# Patient Record
Sex: Female | Born: 1955 | Race: White | Hispanic: No | Marital: Married
Health system: Southern US, Community
[De-identification: ages and names within clinical notes are randomized; demographics above are authoritative.]

---

## 1983-01-02 HISTORY — PX: AUGMENTATION MAMMAPLASTY: SUR837

## 2004-08-24 ENCOUNTER — Ambulatory Visit: Payer: Self-pay | Admitting: Podiatry

## 2006-02-08 ENCOUNTER — Other Ambulatory Visit: Admission: RE | Admit: 2006-02-08 | Discharge: 2006-02-08 | Payer: Self-pay | Admitting: Family Medicine

## 2007-02-10 ENCOUNTER — Other Ambulatory Visit: Admission: RE | Admit: 2007-02-10 | Discharge: 2007-02-10 | Payer: Self-pay | Admitting: Family Medicine

## 2008-03-01 ENCOUNTER — Other Ambulatory Visit: Admission: RE | Admit: 2008-03-01 | Discharge: 2008-03-01 | Payer: Self-pay | Admitting: Family Medicine

## 2009-10-24 ENCOUNTER — Ambulatory Visit: Payer: Self-pay | Admitting: Gastroenterology

## 2010-08-04 ENCOUNTER — Ambulatory Visit: Payer: Self-pay | Admitting: Family Medicine

## 2011-07-19 ENCOUNTER — Ambulatory Visit: Payer: Self-pay | Admitting: Family Medicine

## 2015-05-23 ENCOUNTER — Other Ambulatory Visit: Payer: Self-pay | Admitting: Family Medicine

## 2015-05-23 DIAGNOSIS — Z1231 Encounter for screening mammogram for malignant neoplasm of breast: Secondary | ICD-10-CM

## 2015-10-03 ENCOUNTER — Ambulatory Visit
Admission: RE | Admit: 2015-10-03 | Discharge: 2015-10-03 | Disposition: A | Discharge status: Home / Self Care | Payer: BLUE CROSS/BLUE SHIELD | Source: Ambulatory Visit | Attending: Family Medicine | Admitting: Family Medicine

## 2015-10-03 ENCOUNTER — Encounter: Payer: Self-pay | Admitting: Radiology

## 2015-10-03 DIAGNOSIS — Z1231 Encounter for screening mammogram for malignant neoplasm of breast: Secondary | ICD-10-CM | POA: Diagnosis present

## 2017-11-18 ENCOUNTER — Other Ambulatory Visit: Payer: Self-pay | Admitting: Family Medicine

## 2017-11-18 DIAGNOSIS — Z1231 Encounter for screening mammogram for malignant neoplasm of breast: Secondary | ICD-10-CM

## 2017-11-27 ENCOUNTER — Inpatient Hospital Stay: Admission: RE | Admit: 2017-11-27 | Payer: BLUE CROSS/BLUE SHIELD | Source: Ambulatory Visit

## 2018-02-25 ENCOUNTER — Telehealth: Payer: Self-pay | Admitting: General Surgery

## 2018-02-25 NOTE — Telephone Encounter (Signed)
Patient has called during lunch hour and left a message

## 2018-02-27 ENCOUNTER — Ambulatory Visit: Payer: Self-pay | Admitting: General Surgery

## 2018-06-16 ENCOUNTER — Encounter: Payer: Self-pay | Admitting: General Surgery

## 2019-07-14 ENCOUNTER — Institutional Professional Consult (permissible substitution): Payer: BLUE CROSS/BLUE SHIELD | Admitting: Plastic Surgery

## 2019-08-21 ENCOUNTER — Institutional Professional Consult (permissible substitution): Payer: BLUE CROSS/BLUE SHIELD | Admitting: Plastic Surgery

## 2020-10-04 ENCOUNTER — Other Ambulatory Visit: Payer: Self-pay | Admitting: Family Medicine

## 2020-10-04 DIAGNOSIS — Z1231 Encounter for screening mammogram for malignant neoplasm of breast: Secondary | ICD-10-CM

## 2020-10-20 ENCOUNTER — Ambulatory Visit
Admission: RE | Admit: 2020-10-20 | Discharge: 2020-10-20 | Disposition: A | Discharge status: Home / Self Care | Payer: BC Managed Care – PPO | Source: Ambulatory Visit | Attending: Family Medicine | Admitting: Family Medicine

## 2020-10-20 ENCOUNTER — Other Ambulatory Visit: Payer: Self-pay

## 2020-10-20 DIAGNOSIS — Z1231 Encounter for screening mammogram for malignant neoplasm of breast: Secondary | ICD-10-CM | POA: Diagnosis not present

## 2020-11-07 ENCOUNTER — Inpatient Hospital Stay
Admission: RE | Admit: 2020-11-07 | Discharge: 2020-11-07 | Disposition: A | Discharge status: Home / Self Care | Payer: Self-pay | Source: Ambulatory Visit | Attending: *Deleted | Admitting: *Deleted

## 2020-11-07 ENCOUNTER — Other Ambulatory Visit: Payer: Self-pay | Admitting: *Deleted

## 2020-11-07 DIAGNOSIS — Z1231 Encounter for screening mammogram for malignant neoplasm of breast: Secondary | ICD-10-CM

## 2021-03-01 ENCOUNTER — Other Ambulatory Visit: Payer: Self-pay | Admitting: Orthopedic Surgery

## 2021-03-01 DIAGNOSIS — M25311 Other instability, right shoulder: Secondary | ICD-10-CM

## 2021-03-01 DIAGNOSIS — M7501 Adhesive capsulitis of right shoulder: Secondary | ICD-10-CM

## 2021-03-01 DIAGNOSIS — M7521 Bicipital tendinitis, right shoulder: Secondary | ICD-10-CM

## 2021-03-13 ENCOUNTER — Other Ambulatory Visit: Payer: BC Managed Care – PPO

## 2021-10-30 ENCOUNTER — Other Ambulatory Visit: Payer: Self-pay | Admitting: Family Medicine

## 2021-10-30 DIAGNOSIS — Z1231 Encounter for screening mammogram for malignant neoplasm of breast: Secondary | ICD-10-CM

## 2021-11-22 ENCOUNTER — Ambulatory Visit
Admission: RE | Admit: 2021-11-22 | Discharge: 2021-11-22 | Disposition: A | Discharge status: Home / Self Care | Payer: BC Managed Care – PPO | Source: Ambulatory Visit | Attending: Family Medicine | Admitting: Family Medicine

## 2021-11-22 ENCOUNTER — Other Ambulatory Visit: Payer: Self-pay | Admitting: Family Medicine

## 2021-11-22 DIAGNOSIS — Z1231 Encounter for screening mammogram for malignant neoplasm of breast: Secondary | ICD-10-CM

## 2022-01-04 ENCOUNTER — Ambulatory Visit: Payer: BC Managed Care – PPO

## 2023-04-24 IMAGING — MG DIGITAL SCREENING BREAST BILAT IMPLANT W/ TOMO W/ CAD
8 of 14 series · 8 of 34 positions shown · non-contrast
Comparison: Previous exam(s).

CLINICAL DATA: Screening.

EXAM:
DIGITAL SCREENING BILATERAL MAMMOGRAM WITH IMPLANTS, CAD AND
TOMOSYNTHESIS
TECHNIQUE: Bilateral screening digital craniocaudal and mediolateral oblique
mammograms were obtained. Bilateral screening digital breast
tomosynthesis was performed. The images were evaluated with
computer-aided detection. Standard and/or implant displaced views
were performed.

[L MLO]
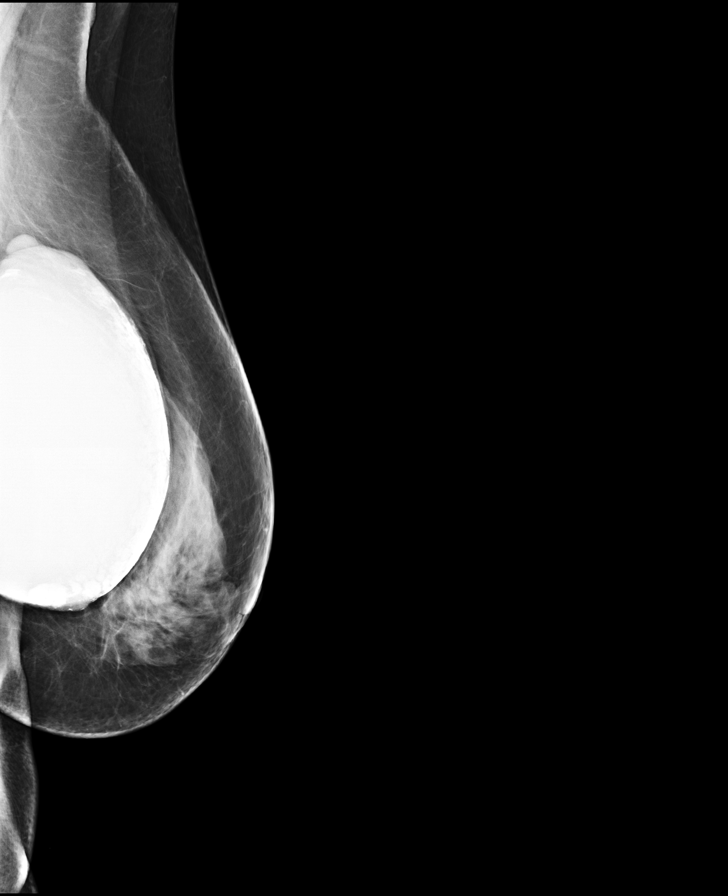

[R MLO]
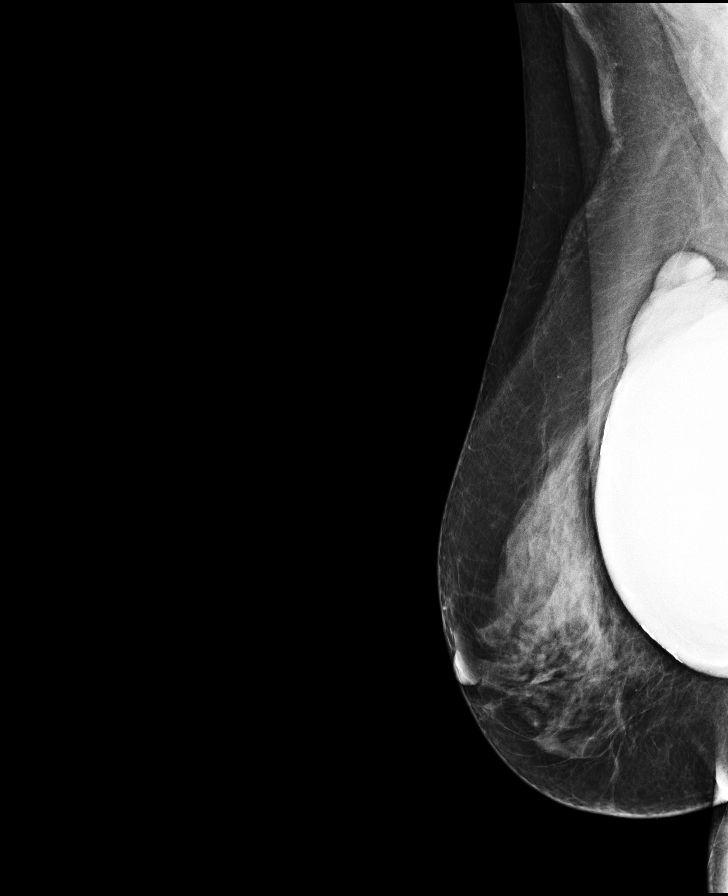

[L CC]
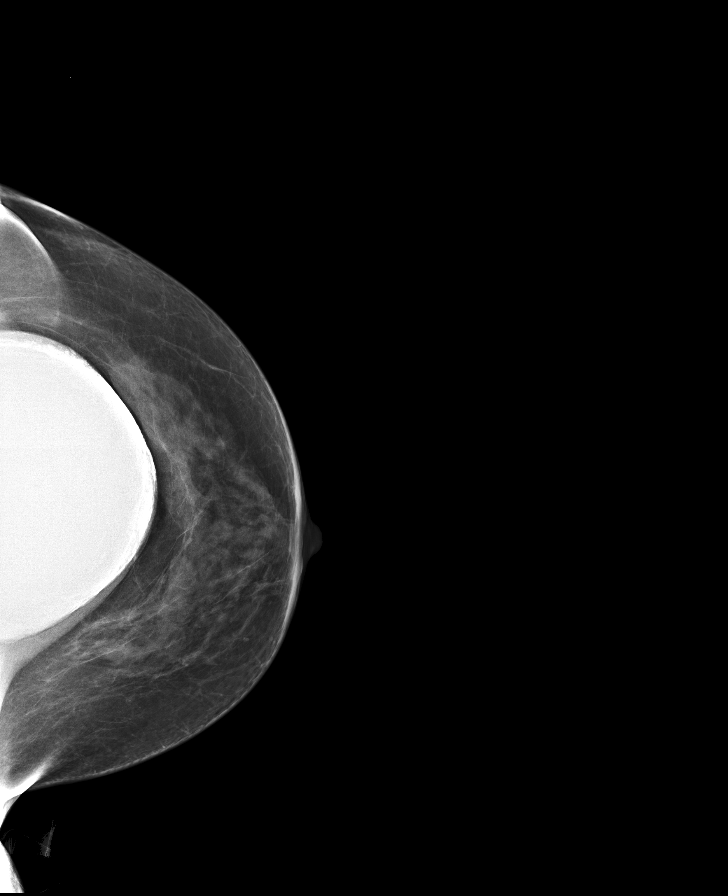

[R CC]
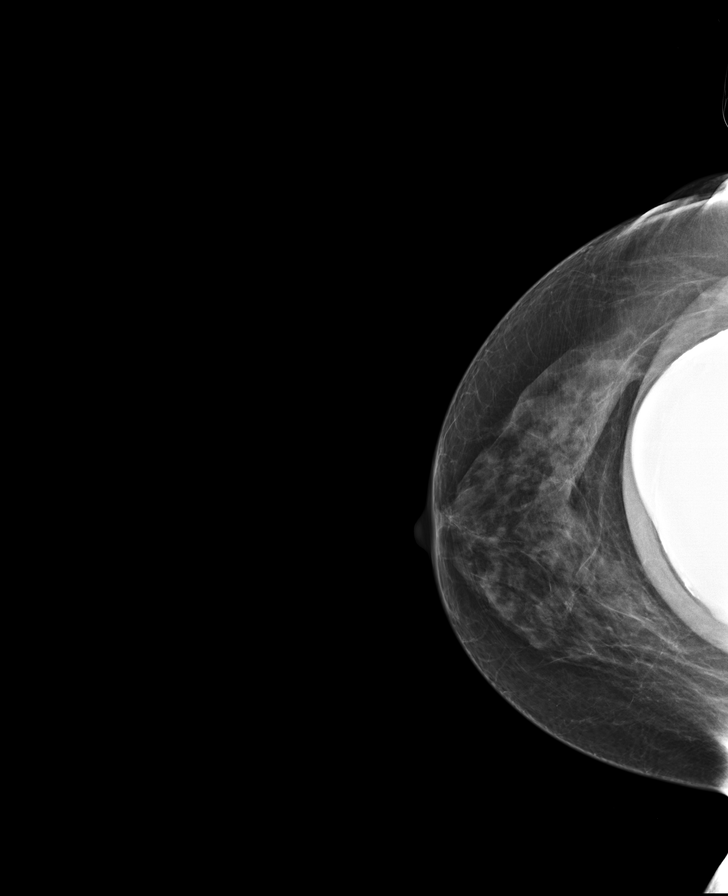

[L MLO synth-2D (1 of 2)]
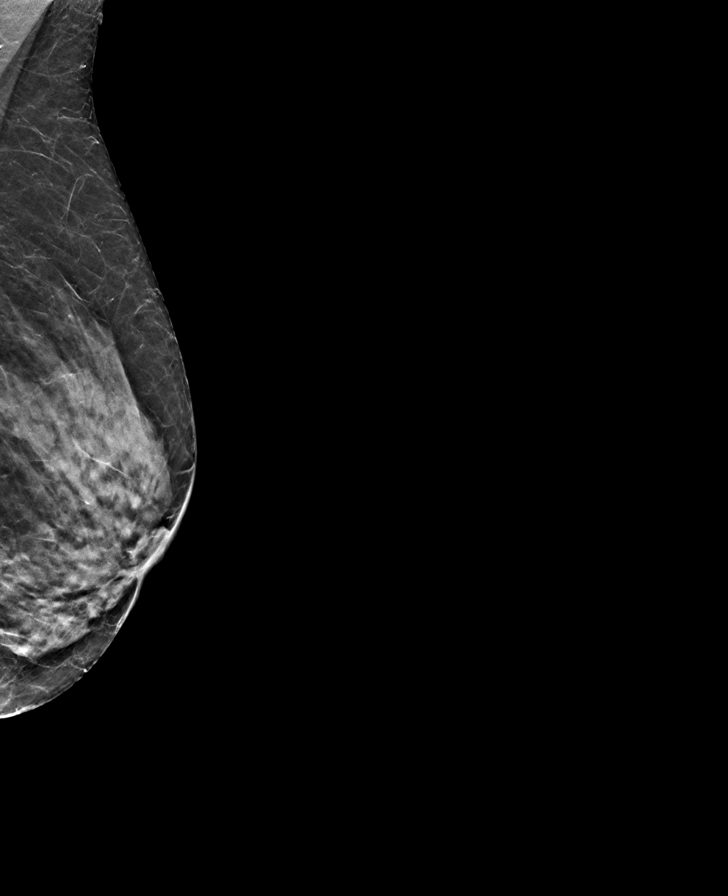

[R CC synth-2D]
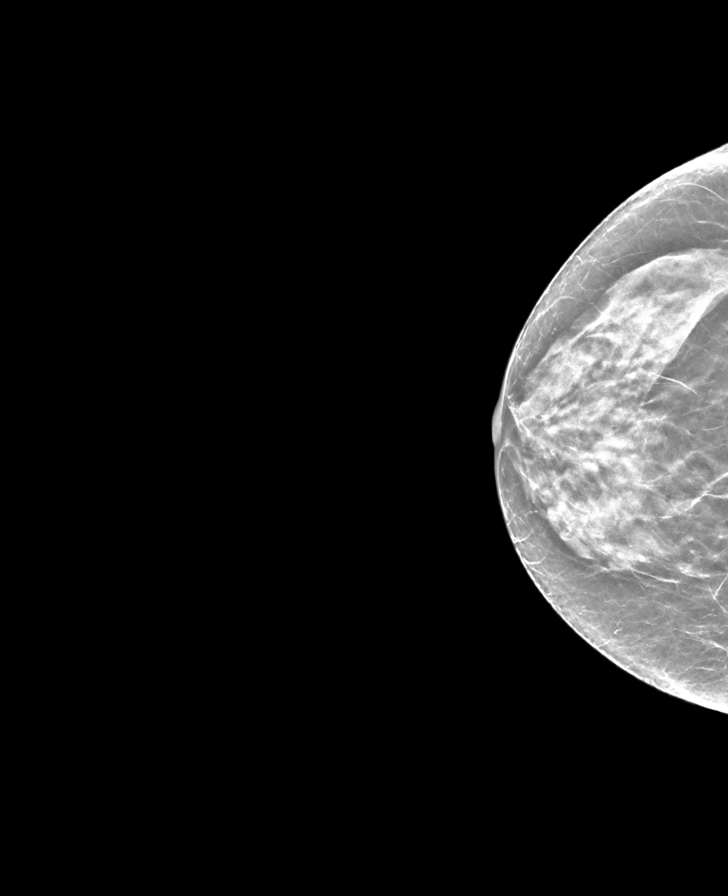

[R MLO synth-2D]
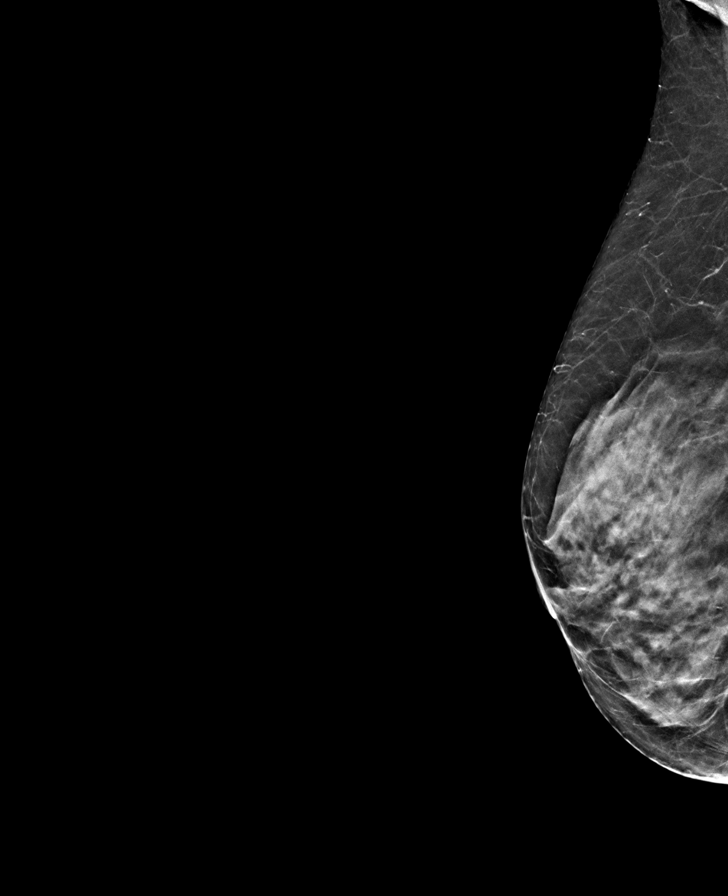

[L MLO synth-2D (2 of 2)]
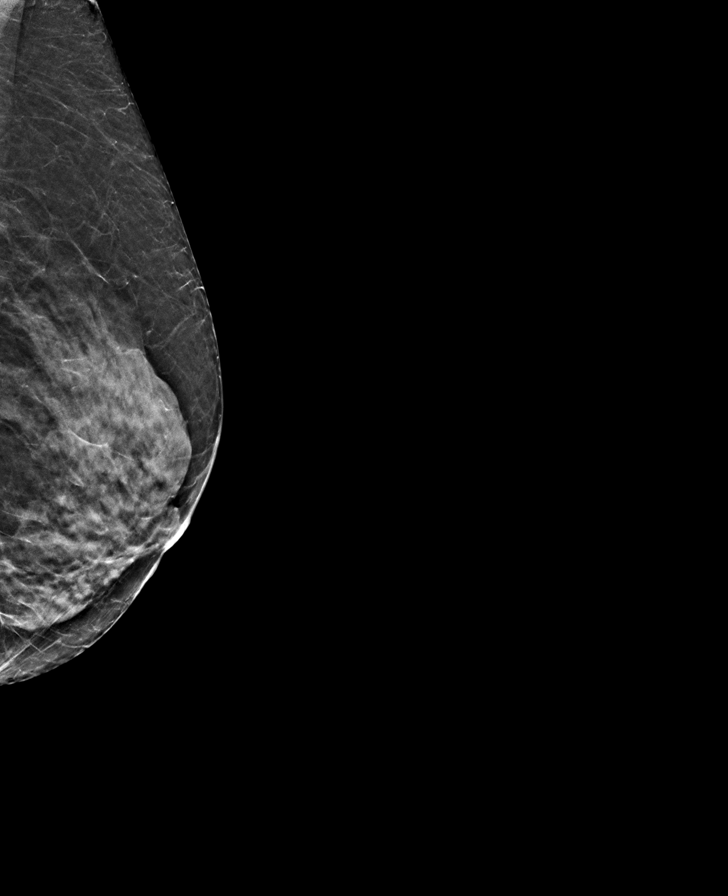

[8 of 34 positions shown; findings below may reference images not displayed]

ACR Breast Density Category c: The breast tissue is heterogeneously
dense, which may obscure small masses.
FINDINGS: The patient has retropectoral implants. There are no findings
suspicious for malignancy. Note is made of extracapsular silicone
along the superior aspect of both implants. Appearance is similar to
previous exam.
IMPRESSION: No mammographic evidence of malignancy. A result letter of this
screening mammogram will be mailed directly to the patient.

RECOMMENDATION:
Screening mammogram in one year. (Code:HL-6-OQS)

BI-RADS CATEGORY  1:  Negative.

## 2023-09-24 ENCOUNTER — Other Ambulatory Visit: Payer: Self-pay | Admitting: Family Medicine

## 2023-09-24 DIAGNOSIS — Z13820 Encounter for screening for osteoporosis: Secondary | ICD-10-CM

## 2023-09-24 DIAGNOSIS — Z780 Asymptomatic menopausal state: Secondary | ICD-10-CM

## 2023-12-02 ENCOUNTER — Ambulatory Visit
Admission: RE | Admit: 2023-12-02 | Discharge: 2023-12-02 | Disposition: A | Source: Ambulatory Visit | Attending: Family Medicine | Admitting: Family Medicine

## 2023-12-02 DIAGNOSIS — Z13820 Encounter for screening for osteoporosis: Secondary | ICD-10-CM | POA: Insufficient documentation

## 2023-12-02 DIAGNOSIS — Z780 Asymptomatic menopausal state: Secondary | ICD-10-CM | POA: Insufficient documentation
